# Patient Record
Sex: Female | Born: 1950 | Race: Black or African American | Hispanic: No | Marital: Married | Smoking: Never smoker
Health system: Southern US, Community
[De-identification: ages and names within clinical notes are randomized; demographics above are authoritative.]

## PROBLEM LIST (undated history)

## (undated) DIAGNOSIS — I10 Essential (primary) hypertension: Secondary | ICD-10-CM

---

## 2000-10-10 ENCOUNTER — Encounter: Payer: Self-pay | Admitting: *Deleted

## 2000-10-10 ENCOUNTER — Ambulatory Visit (HOSPITAL_COMMUNITY): Admission: RE | Admit: 2000-10-10 | Discharge: 2000-10-10 | Payer: Self-pay | Admitting: *Deleted

## 2000-11-04 ENCOUNTER — Encounter: Payer: Self-pay | Admitting: Cardiology

## 2000-11-04 ENCOUNTER — Ambulatory Visit (HOSPITAL_COMMUNITY): Admission: RE | Admit: 2000-11-04 | Discharge: 2000-11-04 | Payer: Self-pay | Admitting: Cardiology

## 2009-02-27 ENCOUNTER — Encounter: Admission: RE | Admit: 2009-02-27 | Discharge: 2009-02-27 | Payer: Self-pay | Admitting: Chiropractic Medicine

## 2009-05-02 ENCOUNTER — Encounter: Admission: RE | Admit: 2009-05-02 | Discharge: 2009-05-02 | Payer: Self-pay | Admitting: Obstetrics and Gynecology

## 2010-02-11 ENCOUNTER — Encounter: Payer: Self-pay | Admitting: Cardiology

## 2011-06-19 IMAGING — CR DG CERVICAL SPINE COMPLETE 4+V
5 series · 5 of 5 positions shown · non-contrast
Comparison: None.

CLINICAL DATA: Neck pain for 6 months, no trauma

CERVICAL SPINE - COMPLETE 4+ VIEW

[view not recorded (1 of 5)]
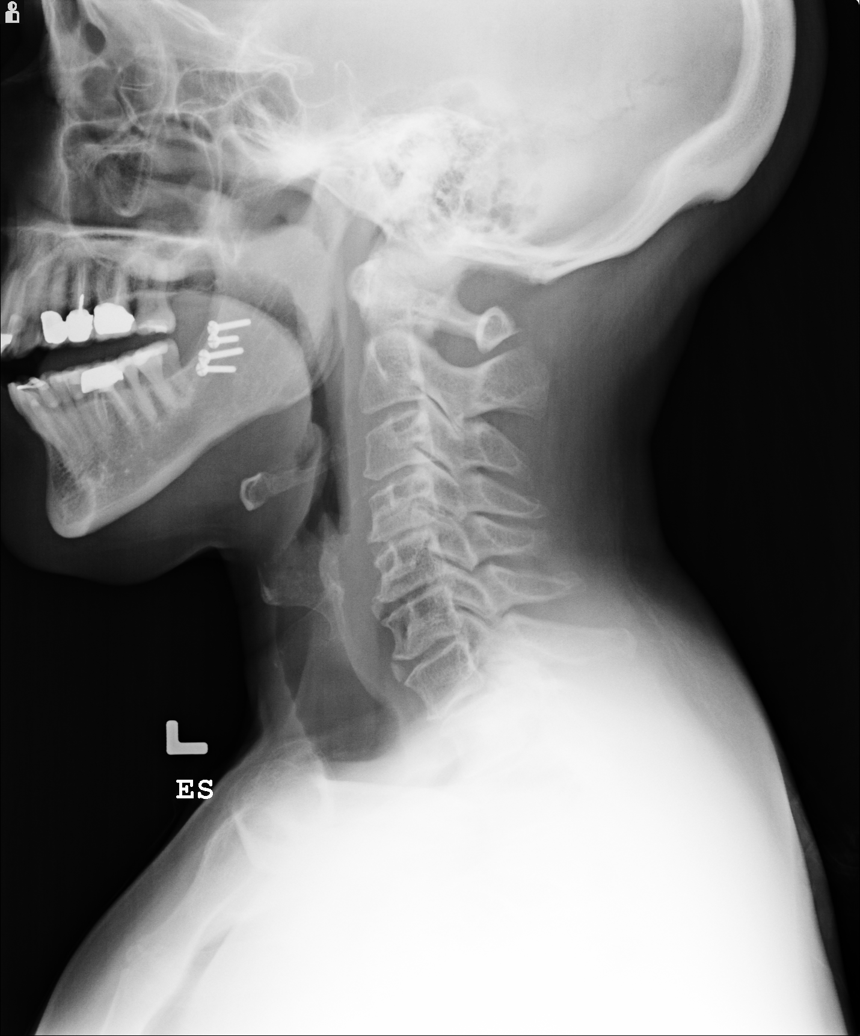

[view not recorded (2 of 5)]
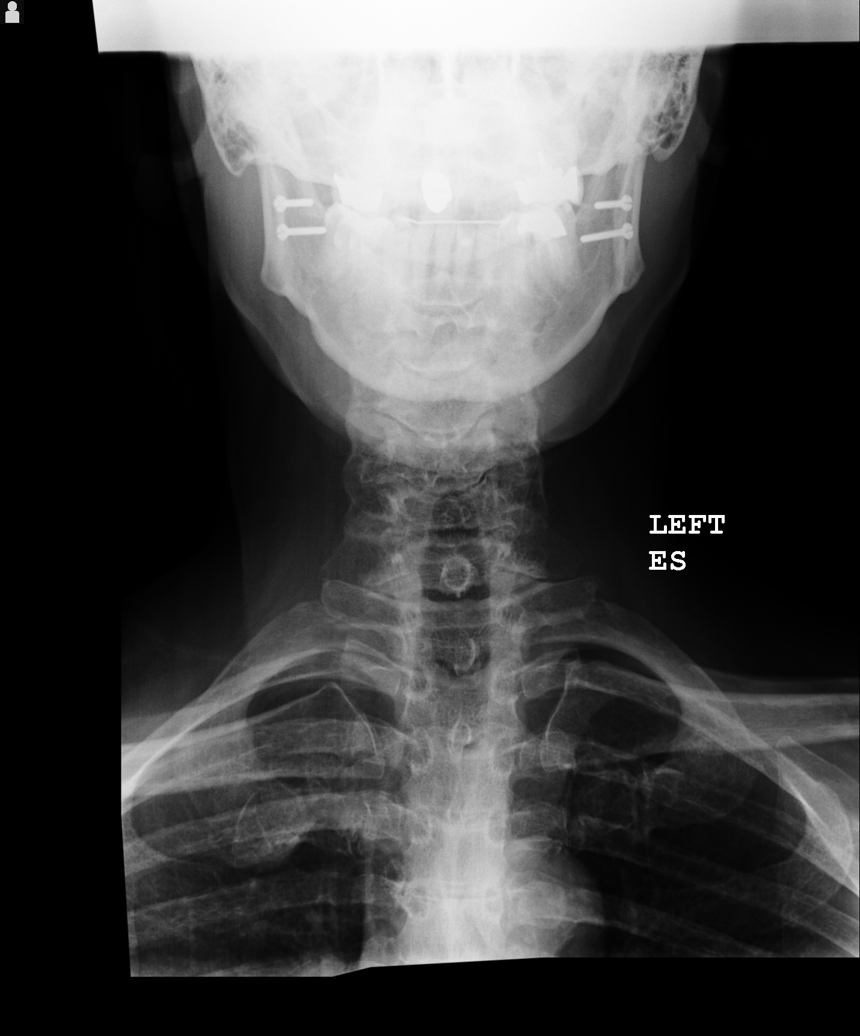

[view not recorded (3 of 5)]
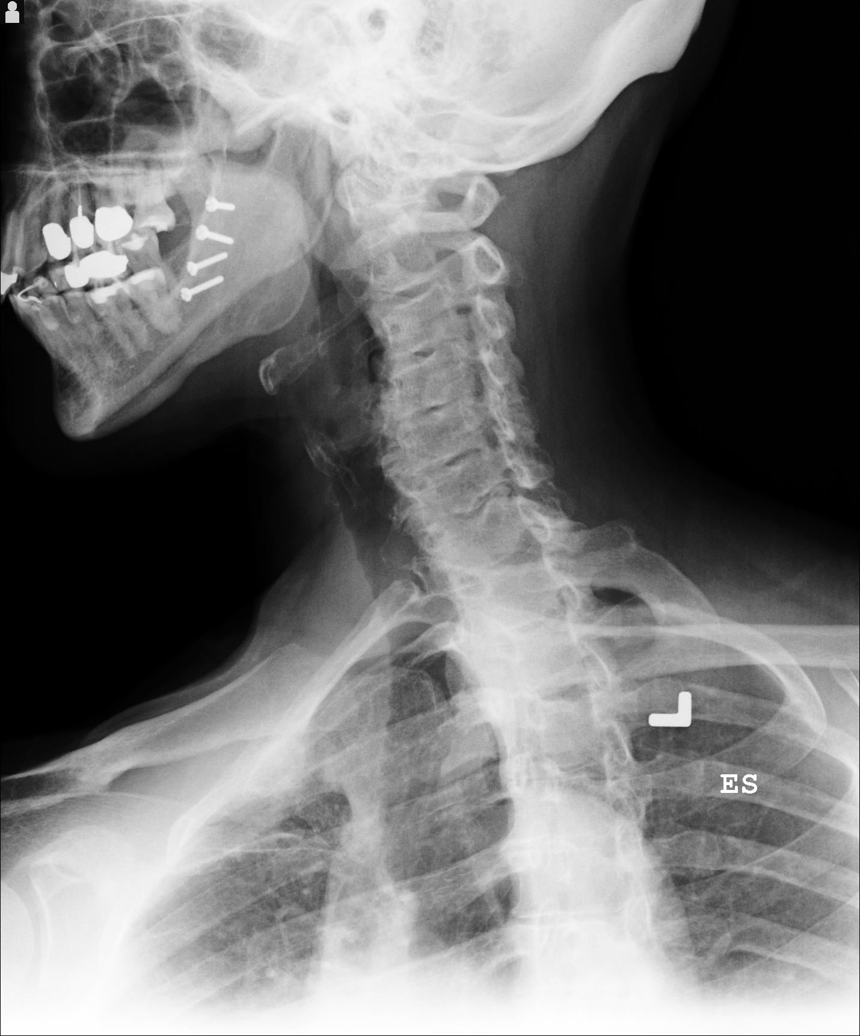

[view not recorded (4 of 5)]
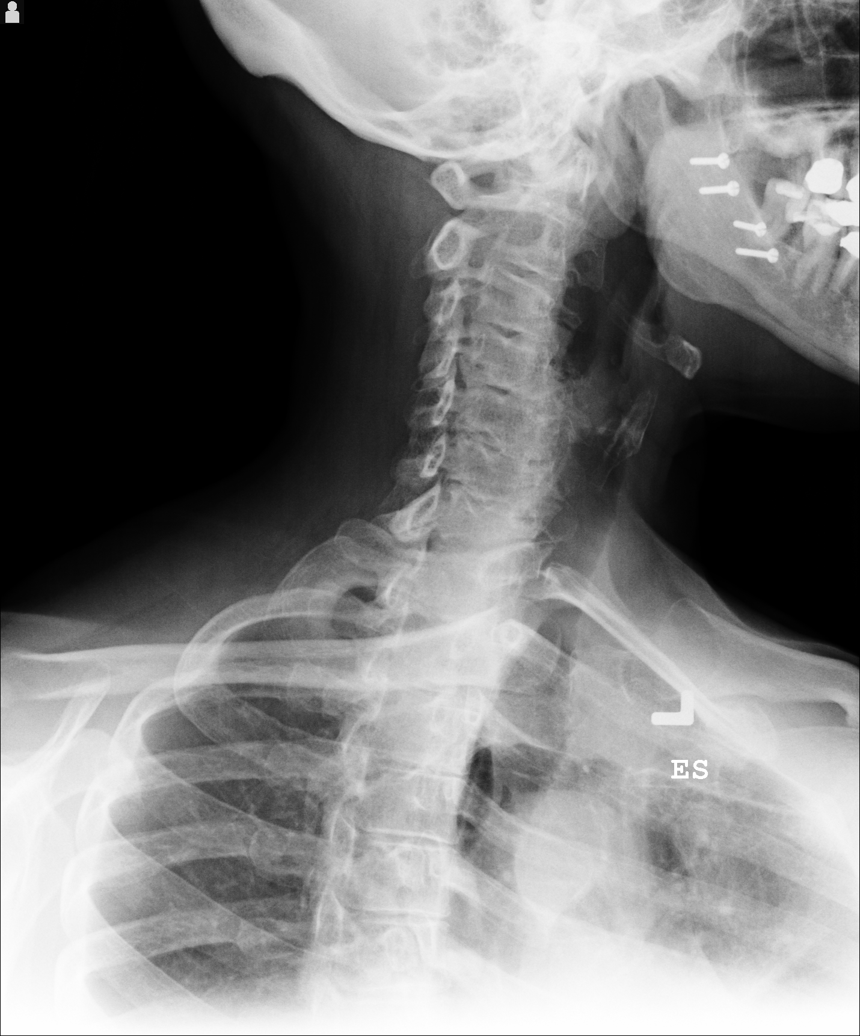

[view not recorded (5 of 5)]
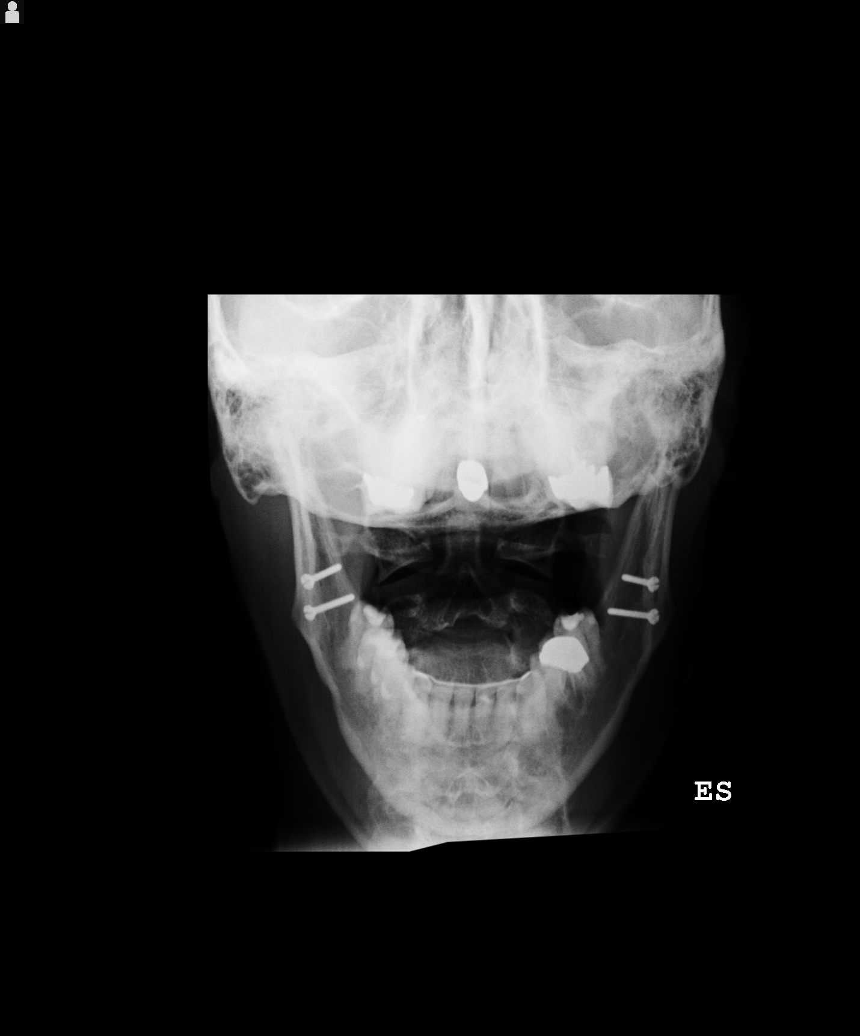

[5 of 5 positions shown; findings below may reference images not displayed]

FINDINGS: The cervical vertebrae are in normal alignment.  There is
degenerative disc disease particularly at C4-5, C5-6, and C6-7
levels where there is some loss of disc space and spurring.  No
prevertebral soft tissue swelling is seen.  On oblique views there
is some foraminal narrowing particularly at C4-5,  C5-6 and C6-7
levels.  The odontoid process is intact.
IMPRESSION: Degenerative disc disease particularly at C4-5, C5-6, and C6-7 with
some foraminal narrowing at these levels.  Normal alignment.

## 2016-09-11 ENCOUNTER — Ambulatory Visit: Payer: Self-pay | Admitting: Cardiovascular Disease

## 2018-12-22 ENCOUNTER — Other Ambulatory Visit: Payer: Self-pay | Admitting: Internal Medicine

## 2018-12-22 DIAGNOSIS — E2839 Other primary ovarian failure: Secondary | ICD-10-CM

## 2018-12-24 ENCOUNTER — Other Ambulatory Visit: Payer: Self-pay | Admitting: Internal Medicine

## 2018-12-24 DIAGNOSIS — Z1231 Encounter for screening mammogram for malignant neoplasm of breast: Secondary | ICD-10-CM

## 2019-03-22 ENCOUNTER — Ambulatory Visit: Payer: Self-pay

## 2019-03-22 ENCOUNTER — Other Ambulatory Visit: Payer: BC Managed Care – PPO

## 2019-05-13 ENCOUNTER — Ambulatory Visit: Payer: BC Managed Care – PPO | Attending: Internal Medicine

## 2019-05-13 ENCOUNTER — Ambulatory Visit: Payer: BC Managed Care – PPO

## 2019-05-13 DIAGNOSIS — Z23 Encounter for immunization: Secondary | ICD-10-CM

## 2019-05-13 NOTE — Progress Notes (Signed)
   Covid-19 Vaccination Clinic  Name:  Gina Dennis    MRN: [id number] DOB: February 14, 1950  05/13/2019  [name] was observed post Covid-19 immunization for 15 minutes without incident. She was provided with Vaccine Information Sheet and instruction to access the V-Safe system.   [name] was instructed to call 911 with any severe reactions post vaccine: Marland Kitchen Difficulty breathing  . Swelling of face and throat  . A fast heartbeat  . A bad rash all over body  . Dizziness and weakness   Immunizations Administered    Name Date Dose VIS Date Route   Pfizer COVID-19 Vaccine 05/13/2019 10:22 AM 0.3 mL 03/17/2018 Intramuscular   Manufacturer: [employer], [employer]   Lot: [id number]   NDC: [address]-1

## 2019-06-07 ENCOUNTER — Ambulatory Visit: Payer: BC Managed Care – PPO | Attending: Internal Medicine

## 2019-06-07 DIAGNOSIS — Z23 Encounter for immunization: Secondary | ICD-10-CM

## 2019-06-07 NOTE — Progress Notes (Signed)
   Covid-19 Vaccination Clinic  Name:  SHRUTHI NORTHRUP    MRN: [id number] DOB: 06-10-1950  06/07/2019  Ms. Krasinski was observed post Covid-19 immunization for 15 minutes without incident. She was provided with Vaccine Information Sheet and instruction to access the V-Safe system.   Ms. Weis was instructed to call 911 with any severe reactions post vaccine: Marland Kitchen Difficulty breathing  . Swelling of face and throat  . A fast heartbeat  . A bad rash all over body  . Dizziness and weakness   Immunizations Administered    Name Date Dose VIS Date Route   Pfizer COVID-19 Vaccine 06/07/2019 12:35 PM 0.3 mL 03/17/2018 Intramuscular   Manufacturer: [employer], [employer]   Lot: [id number]   NDC: [address]-8

## 2022-05-27 ENCOUNTER — Emergency Department (HOSPITAL_BASED_OUTPATIENT_CLINIC_OR_DEPARTMENT_OTHER)
Admission: EM | Admit: 2022-05-27 | Discharge: 2022-05-27 | Disposition: A | Discharge status: Home / Self Care | Payer: BC Managed Care – PPO | Attending: Emergency Medicine | Admitting: Emergency Medicine

## 2022-05-27 ENCOUNTER — Encounter (HOSPITAL_BASED_OUTPATIENT_CLINIC_OR_DEPARTMENT_OTHER): Payer: Self-pay | Admitting: Emergency Medicine

## 2022-05-27 ENCOUNTER — Other Ambulatory Visit: Payer: Self-pay

## 2022-05-27 DIAGNOSIS — I10 Essential (primary) hypertension: Secondary | ICD-10-CM | POA: Diagnosis present

## 2022-05-27 DIAGNOSIS — R001 Bradycardia, unspecified: Secondary | ICD-10-CM | POA: Diagnosis not present

## 2022-05-27 HISTORY — DX: Essential (primary) hypertension: I10

## 2022-05-27 LAB — CBC
HCT: 44 % (ref 36.0–46.0)
Hemoglobin: 15 g/dL (ref 12.0–15.0)
MCH: 30.3 pg (ref 26.0–34.0)
MCHC: 34.1 g/dL (ref 30.0–36.0)
MCV: 88.9 fL (ref 80.0–100.0)
Platelets: 220 10*3/uL (ref 150–400)
RBC: 4.95 MIL/uL (ref 3.87–5.11)
RDW: 12.4 % (ref 11.5–15.5)
WBC: 7.7 10*3/uL (ref 4.0–10.5)
nRBC: 0 % (ref 0.0–0.2)

## 2022-05-27 LAB — TROPONIN I (HIGH SENSITIVITY): Troponin I (High Sensitivity): 12 ng/L (ref <18)

## 2022-05-27 LAB — BASIC METABOLIC PANEL
Anion gap: 10 (ref 5–15)
BUN: 15 mg/dL (ref 8–23)
CO2: 28 mmol/L (ref 22–32)
Calcium: 10 mg/dL (ref 8.9–10.3)
Chloride: 100 mmol/L (ref 98–111)
Creatinine, Ser: 0.95 mg/dL (ref 0.44–1.00)
GFR, Estimated: >60 mL/min (ref >60)
Glucose, Bld: 86 mg/dL (ref 70–99)
Potassium: 3.7 mmol/L (ref 3.5–5.1)
Sodium: 138 mmol/L (ref 135–145)

## 2022-05-27 MED ORDER — ATROPINE SULFATE 1 MG/10ML IJ SOSY
PREFILLED_SYRINGE | INTRAMUSCULAR | Status: AC
  Filled 2022-05-27: qty 10

## 2022-05-27 MED ORDER — ATROPINE SULFATE 1 MG/10ML IJ SOSY: 0.5000 mg | PREFILLED_SYRINGE | Freq: Once | INTRAMUSCULAR | Status: DC

## 2022-05-27 MED ORDER — ATROPINE SULFATE 1 MG/ML IV SOLN
0.5000 mg | Freq: Once | INTRAVENOUS | Status: DC
  Filled 2022-05-27: qty 0.5

## 2022-05-27 MED ORDER — HYDRALAZINE HCL 20 MG/ML IJ SOLN
20.0000 mg | Freq: Once | INTRAMUSCULAR | Status: AC
  Administered 2022-05-27: 20 mg via INTRAVENOUS
  Filled 2022-05-27: qty 1

## 2022-05-27 NOTE — ED Triage Notes (Signed)
Pt arrives pov, steady gait, c/o concern for htn, referred by pcp. Pt endorses being asymptomatic, deneis HA or CP  Endorses non-compliance with htn medications. Reports diastolic 125 at pcp

## 2022-05-27 NOTE — ED Notes (Signed)
Unsuccessful IV attempt LAC, pt tolerated well 

## 2022-05-27 NOTE — ED Notes (Signed)
Pt. Now sitting up in bed, stating she feels "a little better." Fluid bolus complete. BP 178/84 and HR 64.

## 2022-05-27 NOTE — Discharge Instructions (Addendum)
Continue to take your blood pressure medication regularly.  You can take nebivolol at a higher dose however it can also cause decreasing heart rate and your heart rate is already on the lower end..  Follow-up with your primary doctor to discuss additional medications if your blood pressure remains elevated.

## 2022-05-27 NOTE — ED Provider Notes (Signed)
[place] EMERGENCY DEPARTMENT AT [facility] Provider Note   CSN: [id number] Arrival date & time: 05/27/22  1742     History  Chief Complaint  Patient presents with   Hypertension    Gina Dennis is a 72 y.o. female.   Hypertension   Patient has a history of hypertension.  She presents to the ED for evaluation of high blood pressure.  Patient states she has not been taking her blood pressure medication regularly.  Generally though it is well-controlled.  This weekend she was feeling fine but did check her blood pressure and her systolic was over 200 and her diastolic was over 100.  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  Patient denies any trouble with headache.  She is not having any chest pain.  She denies any numbness or weakness.  No shortness of breath.  She denies any new medications or supplements.    Home Medications Prior to Admission medications   Not on File      Allergies    Patient has no allergy information on record.    Review of Systems   Review of Systems  Physical Exam Updated Vital Signs BP (!) 178/84   Pulse 66   Temp (!) 97.5 F (36.4 C)   Resp 15   Ht 1.651 m (5\' 5" )   Wt 70.3 kg   SpO2 99%   BMI 25.79 kg/m  Physical Exam Vitals and nursing note reviewed.  Constitutional:      General: She is not in acute distress.    Appearance: She is well-developed.  HENT:     Head: Normocephalic and atraumatic.     Right Ear: External ear normal.     Left Ear: External ear normal.  Eyes:     General: No scleral icterus.       Right eye: No discharge.        Left eye: No discharge.     Conjunctiva/sclera: Conjunctivae normal.  Neck:     Trachea: No tracheal deviation.  Cardiovascular:     Rate and Rhythm: Normal rate and regular rhythm.  Pulmonary:     Effort: Pulmonary effort is normal. No respiratory distress.      Breath sounds: Normal breath sounds. No stridor. No wheezing or rales.  Abdominal:     General: Bowel sounds are normal. There is no distension.     Palpations: Abdomen is soft.     Tenderness: There is no abdominal tenderness. There is no guarding or rebound.  Musculoskeletal:        General: No tenderness or deformity.     Cervical back: Neck supple.  Skin:    General: Skin is warm and dry.     Findings: No rash.  Neurological:     General: No focal deficit present.     Mental Status: She is alert.     Cranial Nerves: No cranial nerve deficit, dysarthria or facial asymmetry.     Sensory: No sensory deficit.     Motor: No abnormal muscle tone or seizure activity.     Coordination: Coordination normal.  Psychiatric:        Mood and Affect: Mood normal.     ED Results / Procedures / Treatments   Labs (all labs ordered are listed, but only abnormal results are displayed) Labs Reviewed  CBC  BASIC METABOLIC PANEL  TROPONIN I (HIGH SENSITIVITY)    EKG EKG Interpretation  Date/Time:  Monday May 27 2022 20:04:45 EDT Ventricular Rate:  39 PR Interval:  172 QRS Duration: 96 QT Interval:  544 QTC Calculation: 439 R Axis:   30 Text Interpretation: Sinus bradycardia Anteroseptal infarct, old Nonspecific repol abnormality, lateral leads Since last tracing rate faster Confirmed by Linwood Dibbles [phone]) on 05/27/2022 8:09:35 PM  Radiology No results found.  Procedures Procedures    Medications Ordered in ED Medications  hydrALAZINE (APRESOLINE) injection 20 mg (20 mg Intravenous Given 05/27/22 1850)  hydrALAZINE (APRESOLINE) injection 20 mg (20 mg Intravenous Given 05/27/22 1953)    ED Course/ Medical Decision Making/ A&P Clinical Course as of 05/27/22 2127  Mon May 27, 2022  1941 CBC and metabolic performed normal.  First troponin normal [JK]  1942 BP is improving.  Will give an additional dose of medications [JK]  2014 Patient's blood pressure was still elevated I will order  additional dose of hydralazine but patient subsequently had a significant drop in blood pressure. [JK]  2015 Blood pressure down into the 120s.  Heart rate down into the 40s.  Patient was feeling lightheaded.  Suspect somewhat of a vasovagal component as the hydralazine should not directly cause bradycardia.  Blood pressure is slowly starting to improve.  Heart rate is improving.  Discussed giving atropine but family and patient are hesitant to do so understandably.  Will continue to monitor closely and allow the medication to wear off. [JK]  2034 Blood pressure improving.  Now 150 systolic, heart rate is 65 [JK]  2107 Nebivolol 10 [JK]    Clinical Course User Index [JK] Linwood Dibbles, MD                             Medical Decision Making Problems Addressed: Hypertension, unspecified type: acute illness or injury that poses a threat to life or bodily functions  Amount and/or Complexity of Data Reviewed Labs: ordered. Decision-making details documented in ED Course.  Risk Prescription drug management.   Patient presented to the ED for evaluation of hypertension.  Patient without any symptoms.  Blood test reassuring.  No signs of anemia acute kidney injury or signs of cardiac ischemia.  She was given a dose of hydralazine with improvement of her blood pressure.  It remained elevated and a second dose was ordered however before it was completely administered patient experienced low blood pressure and bradycardia.  We held any additional treatment and her blood pressure slowly returned back to her baseline.  Would not have her increase her nebivolol as she already has a low normal heart rate.  Patient states previously when she has elevated blood pressure like this her doctor was given her dose of clonidine but she does not take that regularly.  Will have her follow-up with her primary care doctor to discuss her blood pressure.  Right now we will make sure she is taking her blood pressure medication  regularly which she had not been doing recently and continue to monitor her blood pressure.        Final Clinical Impression(s) / ED Diagnoses Final diagnoses:  Hypertension, unspecified type    Rx / DC Orders ED Discharge Orders     None         Linwood Dibbles, MD 05/27/22 2127

## 2022-05-27 NOTE — ED Notes (Signed)
While giving second ordered dose of hyralazine, pt's HR dropped into the 30's and BP dropped precipitously from 182/61 to 102 sys. Pt. Became dizzy and head was lowered. Dr.Knapp was called to the room. NS fluid bolus begun. Dr.Knapp gave verbal order for 0.5mg  Atropine, but told me to hold it after I took it to room.Only 10mg  of hydralazine was administered.

## 2022-05-27 NOTE — ED Notes (Signed)
Pt ambulatory to restroom

## 2023-02-24 ENCOUNTER — Ambulatory Visit: Payer: BC Managed Care – PPO | Admitting: Family Medicine

## 2023-02-24 DIAGNOSIS — E663 Overweight: Secondary | ICD-10-CM | POA: Insufficient documentation

## 2023-02-24 DIAGNOSIS — E559 Vitamin D deficiency, unspecified: Secondary | ICD-10-CM | POA: Insufficient documentation

## 2023-02-24 DIAGNOSIS — I10 Essential (primary) hypertension: Secondary | ICD-10-CM | POA: Insufficient documentation

## 2023-02-25 ENCOUNTER — Ambulatory Visit (INDEPENDENT_AMBULATORY_CARE_PROVIDER_SITE_OTHER): Payer: Self-pay | Admitting: Family Medicine

## 2023-02-25 ENCOUNTER — Encounter: Payer: Self-pay | Admitting: Family Medicine

## 2023-02-25 VITALS — BP 200/108 | HR 60 | Temp 97.6°F | Resp 18 | Ht 64.5 in | Wt 166.4 lb

## 2023-02-25 DIAGNOSIS — R21 Rash and other nonspecific skin eruption: Secondary | ICD-10-CM | POA: Diagnosis not present

## 2023-02-25 DIAGNOSIS — I160 Hypertensive urgency: Secondary | ICD-10-CM | POA: Diagnosis not present

## 2023-02-25 DIAGNOSIS — Z7689 Persons encountering health services in other specified circumstances: Secondary | ICD-10-CM | POA: Diagnosis not present

## 2023-02-25 MED ORDER — CLONIDINE HCL 0.1 MG PO TABS
0.1000 mg | ORAL_TABLET | Freq: Once | ORAL | Status: AC
Start: 2023-02-25 — End: ?

## 2023-02-25 MED ORDER — TRIAMCINOLONE ACETONIDE 0.1 % EX CREA
1.0000 | TOPICAL_CREAM | Freq: Two times a day (BID) | CUTANEOUS | 1 refills | Status: AC
Start: 2023-02-25 — End: ?

## 2023-02-25 MED ORDER — AMLODIPINE BESYLATE 10 MG PO TABS
10.0000 mg | ORAL_TABLET | Freq: Every day | ORAL | 0 refills | Status: DC
Start: 2023-02-25 — End: 2023-03-10

## 2023-02-25 NOTE — Progress Notes (Signed)
 / New Patient Office Visit  Subjective    Patient ID: Gina Dennis, female    DOB: 29-Jan-1950  Age: 73 y.o. MRN: [id number]  CC:  Chief Complaint  Patient presents with   Establish Care    Patient is here establish care with PCP, Patient is here to discuss her BP issues, Patient also would like a referral  for dermatology     HPI Gina Dennis presents to establish care with this practice. She is new to me.   Elevated blood pressure: Medication compliance: Taking Bystolic 10 mg daily at different times. 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 Not consistently.  Denies chest pain, shortness of breath, lower extremity edema, vision changes, headaches.  Pertinent lab work: needs CMP today  Monitoring at home: 191/103 ,196/103, 194/101, 184/104, 187/115, 187/95 (reports some better blood pressures at times).  Tolerating medication well: no side effects reported.  Continue current medication regimen: Will start amlodipine   Follow-up: will send to cards.  Needs urgent referral to cardiology for evaluation and treatment plan.  Has tried Clonidine  in the past and will bring it down.  Clonidine  0.1 mg given in office today at 1025.  Has been to ED in the past for this and was not happy with them trying to bring her blood pressure down too fast.  Has had uncontrolled blood pressures for a while.  Works in [employer] with increased stress recently.  She will wait in clinic for blood pressure recheck.     Derm referral: rash/discoloration on left chest above clavicle, bilateral arms, and right lower leg. Using natural cream on skin. Itching. Present for one month.  Wonders if this is a parasite.    Outpatient Encounter Medications as of 02/25/2023  Medication Sig   amLODipine  (NORVASC ) 10 MG tablet Take 1 tablet (10 mg total) by mouth daily.   nebivolol (BYSTOLIC) 10 MG tablet Take 10 mg by mouth daily.   triamcinolone  cream (KENALOG )  0.1 % Apply 1 Application topically 2 (two) times daily.   Facility-Administered Encounter Medications as of 02/25/2023  Medication   cloNIDine  (CATAPRES ) tablet 0.1 mg    Past Medical History:  Diagnosis Date   Hypertension     History reviewed. No pertinent surgical history.  Family History  Problem Relation Age of Onset   Hypertension Mother    Hypertension Father     Social History   Socioeconomic History   Marital status: Married    Spouse name: Not on file   Number of children: Not on file   Years of education: Not on file   Highest education level: Not on file  Occupational History   Not on file  Tobacco Use   Smoking status: Never    Passive exposure: Never   Smokeless tobacco: Never  Vaping Use   Vaping status: Never Used  Substance and Sexual Activity   Alcohol use: Never   Drug use: Never   Sexual activity: Not Currently  Other Topics Concern   Not on file  Social History Narrative   Not on file   Social Drivers of Health   Financial Resource Strain: Not on file  Food Insecurity: Not on file  Transportation Needs: Not on file  Physical Activity: Not on file  Stress: Not on file (11/25/2022)  Social Connections: Not on file  Intimate Partner Violence: Low Risk  (05/05/2019)   Received from [facility]   Intimate Partner Violence  Insults You: Not on file    Threatens You: Not on file    Screams at You: Not on file    Physically Hurt: Not on file    Intimate Partner Violence Score: Not on file    Review of Systems  Eyes:  Negative for blurred vision and double vision.  Respiratory:  Negative for shortness of breath.   Cardiovascular:  Negative for chest pain and leg swelling.  Neurological:  Negative for dizziness and headaches.        Objective    BP (!) 200/108 (BP Location: Right Arm, Patient Position: Sitting, Cuff Size: Normal)   Pulse 60   Temp 97.6 F (36.4 C) (Oral)   Resp 18   Ht 5' 4.5 (1.638 m)   Wt  166 lb 6.4 oz (75.5 kg)   SpO2 100%   BMI 28.12 kg/m   Physical Exam Vitals and nursing note reviewed.  Constitutional:      General: She is not in acute distress.    Appearance: Normal appearance. She is normal weight.  Cardiovascular:     Rate and Rhythm: Regular rhythm.     Heart sounds: Normal heart sounds.  Pulmonary:     Effort: Pulmonary effort is normal.     Breath sounds: Normal breath sounds.  Skin:    General: Skin is warm and dry.  Neurological:     General: No focal deficit present.     Mental Status: She is alert. Mental status is at baseline.  Psychiatric:        Mood and Affect: Mood normal.        Behavior: Behavior normal.        Thought Content: Thought content normal.        Judgment: Judgment normal.        Assessment & Plan:   Problem List Items Addressed This Visit     Establishing care with new doctor, encounter for - Primary   Asymptomatic hypertensive urgency   Uncontrolled blood pressure in office and at home.  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  Denies chest pain, shortness of breath, vision changes, headaches or dizziness.  She has not been consistent with her Bystolic 10 mg daily as prescribed.  Reports to provider that clonidine  has been effective in the past to bring her blood pressure down.  Clonidine  0.1 mg given in clinic today.  She is resting in exam room for blood pressure recheck.  Urgent referral placed for asymptomatic hypertension urgency to cardiology for evaluation/treatment.  Blood pressure improved upon recheck after clonidine , however, not at goal.   She will wean herself off of the Bystolic, start amlodipine  10 mg daily today.  Recommend DASH diet and moderate walking, reports that she is a runner, instructed to avoid running until blood pressure is under control.  She will follow-up in 2 weeks with blood pressure log, sooner if needed.  Symptoms reviewed that would warrant seeking a higher level  of care such as chest pain, shortness of breath, headache.  She understands that she should report to the emergency room if these symptoms occur.  She is currently asymptomatic. CMP today to assess electrolytes and kidney function. Strongly encourage being consistent with medication and do not miss doses.       Relevant Medications   cloNIDine  (CATAPRES ) tablet 0.1 mg   amLODipine  (NORVASC ) 10 MG tablet   Other Relevant Orders   Comprehensive metabolic panel   Ambulatory referral to Cardiology  Rash and nonspecific skin eruption   Areas of skin discoloration on left chest above clavicle, bilateral arms and right lower leg.  She reports this is accompanied by itching.  Symptoms have been present for 1 month.  She is using a natural cream on the skin, unable to name this product.  She wonders if this is a parasite.  Will prescribe some triamcinolone  cream today, place an urgent referral for dermatology for further evaluation/treatment.      Relevant Medications   triamcinolone  cream (KENALOG ) 0.1 %   Other Relevant Orders   Ambulatory referral to Dermatology  Agrees with plan of care discussed.  Questions answered.   Return in about 2 weeks (around 03/11/2023) for HTN.   Darice JONELLE Brownie, FNP

## 2023-02-25 NOTE — Patient Instructions (Addendum)
 Weaning off of the beta blocker: Take every other day for one week, then every 2 days for one week, then every 3 days for one week, then every 4 days, then stop.  Start amlodipine  today. Monitor blood pressure daily. DASH diet  Moderate walking for now.   Healthy Heart:  Recommend heart healthy/Mediterranean diet, with whole grains, fruits, vegetable, fish, lean meats, nuts, and olive oil. Limit salt. Recommend moderate walking, 3-5 times/week for 30-50 minutes each session. Aim for at least 150 minutes.week. Goal should be pace of 3 miles/hours, or walking 1.5 miles in 30 minutes Recommend avoidance of tobacco products. Avoid excess alcohol.

## 2023-02-25 NOTE — Assessment & Plan Note (Signed)
 Areas of skin discoloration on left chest above clavicle, bilateral arms and right lower leg.  She reports this is accompanied by itching.  Symptoms have been present for 1 month.  She is using a natural cream on the skin, unable to name this product.  She wonders if this is a parasite.  Will prescribe some triamcinolone  cream today, place an urgent referral for dermatology for further evaluation/treatment.

## 2023-02-25 NOTE — Assessment & Plan Note (Addendum)
 Uncontrolled blood pressure in office and at home.  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  Denies chest pain, shortness of breath, vision changes, headaches or dizziness.  She has not been consistent with her Bystolic 10 mg daily as prescribed.  Reports to provider that clonidine  has been effective in the past to bring her blood pressure down.  Clonidine  0.1 mg given in clinic today.  She is resting in exam room for blood pressure recheck.  Urgent referral placed for asymptomatic hypertension urgency to cardiology for evaluation/treatment.  Blood pressure improved upon recheck after clonidine , however, not at goal.   She will wean herself off of the Bystolic, start amlodipine  10 mg daily today.  Recommend DASH diet and moderate walking, reports that she is a runner, instructed to avoid running until blood pressure is under control.  She will follow-up in 2 weeks with blood pressure log, sooner if needed.  Symptoms reviewed that would warrant seeking a higher level of care such as chest pain, shortness of breath, headache.  She understands that she should report to the emergency room if these symptoms occur.  She is currently asymptomatic. CMP today to assess electrolytes and kidney function. Strongly encourage being consistent with medication and do not miss doses.

## 2023-02-26 LAB — COMPREHENSIVE METABOLIC PANEL
ALT: 15 [IU]/L (ref 0–32)
AST: 24 [IU]/L (ref 0–40)
Albumin: 4.1 g/dL (ref 3.9–4.9)
Alkaline Phosphatase: 67 [IU]/L (ref 44–121)
BUN/Creatinine Ratio: 16 (ref 12–28)
BUN: 15 mg/dL (ref 8–27)
Bilirubin Total: 0.6 mg/dL (ref 0.0–1.2)
CO2: 24 mmol/L (ref 20–29)
Calcium: 9.4 mg/dL (ref 8.7–10.3)
Chloride: 100 mmol/L (ref 96–106)
Creatinine, Ser: 0.96 mg/dL (ref 0.57–1.00)
Globulin, Total: 2.7 g/dL (ref 1.5–4.5)
Glucose: 96 mg/dL (ref 70–99)
Potassium: 4 mmol/L (ref 3.5–5.2)
Sodium: 140 mmol/L (ref 134–144)
Total Protein: 6.8 g/dL (ref 6.0–8.5)
eGFR: 64 mL/min/{1.73_m2} (ref >59)

## 2023-03-10 ENCOUNTER — Ambulatory Visit (INDEPENDENT_AMBULATORY_CARE_PROVIDER_SITE_OTHER): Payer: No Typology Code available for payment source | Admitting: Family Medicine

## 2023-03-10 ENCOUNTER — Telehealth: Payer: Self-pay | Admitting: Family Medicine

## 2023-03-10 ENCOUNTER — Encounter: Payer: Self-pay | Admitting: Family Medicine

## 2023-03-10 VITALS — BP 188/75 | HR 71 | Temp 97.7°F | Ht 64.5 in | Wt 165.0 lb

## 2023-03-10 DIAGNOSIS — I10 Essential (primary) hypertension: Secondary | ICD-10-CM | POA: Diagnosis not present

## 2023-03-10 MED ORDER — AMLODIPINE-OLMESARTAN 10-20 MG PO TABS: 1.0000 | ORAL_TABLET | Freq: Every day | ORAL | 0 refills | Status: DC

## 2023-03-10 NOTE — Assessment & Plan Note (Addendum)
Taking amlodipine 10 mg daily as prescribed.  Denies chest pain, shortness of breath, lower extremity edema, vision changes, headaches.  Pertinent lab work: 2/4 CMP normal  Blood pressure is not at goal. Inquired about having 1 or 2 clonidine on hand in case of emergency. Discussed going on clonidine 0.1 mg twice daily, does not want to do this. Amlodipine 10 mg with olmesartan 20 mg daily prescribed.  Understands this is a combination drug, she will stop the amlodipine 10 mg as previously prescribed. Monitor blood pressure and keep blood pressure log. Recommend DASH diet, moderate exercise. Follow-up in 2 weeks with blood pressure log. Encouraged patient to sign up on my chart.

## 2023-03-10 NOTE — Telephone Encounter (Signed)
-----   Message from Novella Olive sent at 02/26/2023  7:06 AM EST ----- Please notify Gina Dennis that her CMP is normal.  Ask her how her blood pressure has been since starting the amlodipine. Thank you, Clydie Braun

## 2023-03-10 NOTE — Progress Notes (Signed)
Established Patient Office Visit  Subjective   Patient ID: Gina Dennis, female    DOB: 1950-11-23  Age: 73 y.o. MRN: [id number]  Chief Complaint  Patient presents with   Hypertension    Home BP's log patient brought in home BP log  Requesting rx for one or two clonidine to have on hand just for emergency     HPI  Hypertension Medication compliance: Taking amlodipine 10 mg daily as prescribed.  Denies chest pain, shortness of breath, lower extremity edema, vision changes, headaches.  Pertinent lab work: 2/4 CMP normal  Monitoring at home: 115-187/85-95 Tolerating medication well: no side effects reported.  Continue current medication regimen: add olmesartan 20 mg  Follow-up: 2 weeks    Review of Systems  Eyes:  Negative for blurred vision and double vision.  Respiratory:  Negative for shortness of breath.   Cardiovascular:  Negative for chest pain and leg swelling.  Neurological:  Negative for headaches.      Objective:     BP (!) 188/75   Pulse 71   Temp 97.7 F (36.5 C)   Ht 5' 4.5" (1.638 m)   Wt 165 lb (74.8 kg)   SpO2 98%   BMI 27.88 kg/m  BP Readings from Last 3 Encounters:  03/10/23 (!) 188/75  02/25/23 (!) 200/108  05/27/22 (!) 190/81      Physical Exam Vitals and nursing note reviewed.  Constitutional:      General: She is not in acute distress.    Appearance: Normal appearance.  Cardiovascular:     Rate and Rhythm: Regular rhythm.     Heart sounds: Normal heart sounds.  Pulmonary:     Effort: Pulmonary effort is normal.     Breath sounds: Normal breath sounds.  Skin:    General: Skin is warm and dry.  Neurological:     General: No focal deficit present.     Mental Status: She is alert. Mental status is at baseline.  Psychiatric:        Mood and Affect: Mood normal.        Behavior: Behavior normal.        Thought Content: Thought content normal.        Judgment: Judgment normal.     No results found for any visits on  03/10/23.  Last metabolic panel Lab Results  Component Value Date   GLUCOSE 96 02/25/2023   NA 140 02/25/2023   K 4.0 02/25/2023   CL 100 02/25/2023   CO2 24 02/25/2023   BUN 15 02/25/2023   CREATININE 0.96 02/25/2023   EGFR 64 02/25/2023   CALCIUM 9.4 02/25/2023   PROT 6.8 02/25/2023   ALBUMIN 4.1 02/25/2023   LABGLOB 2.7 02/25/2023   BILITOT 0.6 02/25/2023   ALKPHOS 67 02/25/2023   AST 24 02/25/2023   ALT 15 02/25/2023   ANIONGAP 10 05/27/2022      The ASCVD Risk score (Arnett DK, et al., 2019) failed to calculate for the following reasons:   Cannot find a previous HDL lab   Cannot find a previous total cholesterol lab    Assessment & Plan:   Problem List Items Addressed This Visit     Elevated blood pressure reading in office with diagnosis of hypertension - Primary    Taking amlodipine 10 mg daily as prescribed.  Denies chest pain, shortness of breath, lower extremity edema, vision changes, headaches.  Pertinent lab work: 2/4 CMP normal  Blood pressure is not at goal. Inquired about  having 1 or 2 clonidine on hand in case of emergency. Discussed going on clonidine 0.1 mg twice daily, does not want to do this. Amlodipine 10 mg with olmesartan 20 mg daily prescribed.  Understands this is a combination drug, she will stop the amlodipine 10 mg as previously prescribed. Monitor blood pressure and keep blood pressure log. Recommend DASH diet, moderate exercise. Follow-up in 2 weeks with blood pressure log. Encouraged patient to sign up on my chart.        Relevant Medications   amlodipine-olmesartan (AZOR) 10-20 MG tablet  Agrees with plan of care discussed.  Questions answered.  Face to face time: 29 minutes discussing treatment options.  Return in about 2 weeks (around 03/24/2023) for HTN.    Novella Olive, FNP

## 2023-03-10 NOTE — Telephone Encounter (Signed)
Results to be reviewed with patient at upcoming visit.

## 2023-03-20 ENCOUNTER — Encounter (HOSPITAL_BASED_OUTPATIENT_CLINIC_OR_DEPARTMENT_OTHER): Payer: Self-pay

## 2023-03-24 ENCOUNTER — Encounter: Payer: Self-pay | Admitting: Family Medicine

## 2023-03-24 ENCOUNTER — Ambulatory Visit (INDEPENDENT_AMBULATORY_CARE_PROVIDER_SITE_OTHER): Payer: No Typology Code available for payment source | Admitting: Family Medicine

## 2023-03-24 VITALS — BP 146/84 | HR 74 | Ht 64.0 in | Wt 163.2 lb

## 2023-03-24 DIAGNOSIS — I10 Essential (primary) hypertension: Secondary | ICD-10-CM | POA: Diagnosis not present

## 2023-03-24 MED ORDER — AMLODIPINE BESYLATE 10 MG PO TABS
10.0000 mg | ORAL_TABLET | Freq: Every day | ORAL | 0 refills | Status: DC
Start: 2023-03-24 — End: 2023-04-29

## 2023-03-24 MED ORDER — HYDROCHLOROTHIAZIDE 25 MG PO TABS
25.0000 mg | ORAL_TABLET | Freq: Every day | ORAL | 0 refills | Status: DC
Start: 2023-03-24 — End: 2023-05-05

## 2023-03-24 NOTE — Progress Notes (Signed)
 Established Patient Office Visit  Subjective   Patient ID: Gina Dennis, female    DOB: 06-06-1950  Age: 73 y.o. MRN: [id number]  Chief Complaint  Patient presents with   Hypertension    BP follow up =kph    HPI  Hypertension Medication compliance: Taking amlodipine-olmesartan 10-20 mg (half dose every other day).  Denies chest pain, shortness of breath,vision changes, headaches. Endorsing lower extremity swelling after starting the amlodipine-olmesartan.  Pertinent lab work: 2/4:  Monitoring at home: 136-168/70-99  Tolerating medication well: ankle swelling Continue current medication regimen: will stop olmesartan and add hydrochlorothiazide  Follow-up: 3 weeks.    Review of Systems  Eyes:  Negative for blurred vision and double vision.  Respiratory:  Negative for shortness of breath.   Cardiovascular:  Positive for leg swelling. Negative for chest pain.  Neurological:  Negative for dizziness and headaches.      Objective:     BP (!) 146/84 (BP Location: Right Arm, Patient Position: Sitting, Cuff Size: Normal)   Pulse 74   Ht 5\' 4"  (1.626 m)   Wt 163 lb 3 oz (74 kg)   SpO2 98%   BMI 28.01 kg/m  BP Readings from Last 3 Encounters:  03/24/23 (!) 146/84  03/10/23 (!) 188/75  02/25/23 (!) 200/108      Physical Exam Vitals and nursing note reviewed.  Constitutional:      General: She is not in acute distress.    Appearance: Normal appearance.  Cardiovascular:     Rate and Rhythm: Normal rate and regular rhythm.     Heart sounds: Normal heart sounds.  Pulmonary:     Effort: Pulmonary effort is normal.     Breath sounds: Normal breath sounds.  Skin:    General: Skin is warm and dry.  Neurological:     General: No focal deficit present.     Mental Status: She is alert. Mental status is at baseline.  Psychiatric:        Mood and Affect: Mood normal.        Behavior: Behavior normal.        Thought Content: Thought content normal.        Judgment:  Judgment normal.     No results found for any visits on 03/24/23.    The ASCVD Risk score (Arnett DK, et al., 2019) failed to calculate for the following reasons:   Cannot find a previous HDL lab   Cannot find a previous total cholesterol lab    Assessment & Plan:   Problem List Items Addressed This Visit     Elevated blood pressure reading in office with diagnosis of hypertension - Primary   Taking amlodipine-olmesartan 10-20 mg (half dose every other day) due to swelling in bilateral lower extremities. Reports swelling started after first dose.  Did not have swelling with just amlodipine 10 mg alone. Denies chest pain, shortness of breath,vision changes, headaches.  Will continue amlodipine 10 mg and add hydrochlorothiazide 25 mg with continued blood pressure monitoring. She will return in 3 weeks with blood pressure log. Home pressures are improved, however, not at goal. Improved upon manual recheck in office today.  Recheck BMP at that visit.        Relevant Medications   amLODipine (NORVASC) 10 MG tablet   hydrochlorothiazide (HYDRODIURIL) 25 MG tablet  Agrees with plan of care discussed.  Questions answered.   Return in about 3 weeks (around 04/14/2023) for HTN.    Novella Olive, FNP

## 2023-03-24 NOTE — Assessment & Plan Note (Signed)
 Taking amlodipine-olmesartan 10-20 mg (half dose every other day) due to swelling in bilateral lower extremities. Reports swelling started after first dose.  Did not have swelling with just amlodipine 10 mg alone. Denies chest pain, shortness of breath,vision changes, headaches.  Will continue amlodipine 10 mg and add hydrochlorothiazide 25 mg with continued blood pressure monitoring. She will return in 3 weeks with blood pressure log. Home pressures are improved, however, not at goal. Improved upon manual recheck in office today.  Recheck BMP at that visit.

## 2023-04-14 ENCOUNTER — Ambulatory Visit (INDEPENDENT_AMBULATORY_CARE_PROVIDER_SITE_OTHER): Admitting: Family Medicine

## 2023-04-14 ENCOUNTER — Encounter: Payer: Self-pay | Admitting: Family Medicine

## 2023-04-14 VITALS — BP 170/94 | HR 64 | Temp 97.4°F | Resp 18 | Ht 64.0 in | Wt 165.8 lb

## 2023-04-14 DIAGNOSIS — I10 Essential (primary) hypertension: Secondary | ICD-10-CM | POA: Diagnosis not present

## 2023-04-14 NOTE — Assessment & Plan Note (Addendum)
 Taking amlodipine 10 mg and hydrochlorothiazide 25 mg daily on most days. Reports not taking either medication when ankles are swollen.  Denies chest pain, shortness of breath, vision changes, headaches. Endorses bilateral ankle swelling at times.  Has missed some days of medication due to ankle swelling. Blood pressure log with some well controlled readings. Plan: Do not skip any doses/days of taking medication. Understands that changing regimen would not be prudent with missing days of treatment.  Keep log. Continue with healthy eating and exercise. Return in 2 weeks with BP log. If continues to be above goal, will adjust medications. Will check BMP once taking medication every day as prescribed.

## 2023-04-14 NOTE — Progress Notes (Signed)
 Established Patient Office Visit  Subjective   Patient ID: Gina Dennis, female    DOB: Jan 09, 1951  Age: 73 y.o. MRN: [id number]  Chief Complaint  Patient presents with   Follow-up    Patient is here for a 3 week follow up for BP      HPI   Hypertension Medication compliance: Taking amlodipine 10 mg and hydrochlorothiazide 25 mg daily on most days. Reports not taking either medication when ankles are swollen.  Denies chest pain, shortness of breath, vision changes, headaches. Endorses bilateral ankle  swelling at times.  Pertinent lab work: 02/25/23: CMP K+ 4.0, GFR 64 Monitoring at home: 120s-140s/70s-80s.  Tolerating medication well: some lower extremity swelling, no swelling today.  Continue current medication regimen: no changes today, will take daily with monitoring.  Follow-up: 2 weeks with blood pressure log  Review of Systems  Respiratory:  Negative for shortness of breath.   Cardiovascular:  Positive for leg swelling. Negative for chest pain.  Neurological:  Negative for headaches.      Objective:     BP (!) 170/94 (BP Location: Right Arm, Patient Position: Sitting, Cuff Size: Normal)   Pulse 64   Temp (!) 97.4 F (36.3 C) (Oral)   Resp 18   Ht 5\' 4"  (1.626 m)   Wt 165 lb 12.8 oz (75.2 kg)   SpO2 97%   BMI 28.46 kg/m  BP Readings from Last 3 Encounters:  04/14/23 (!) 170/94  03/24/23 (!) 146/84  03/10/23 (!) 188/75      Physical Exam Vitals and nursing note reviewed.  Constitutional:      Appearance: Normal appearance.  Cardiovascular:     Rate and Rhythm: Normal rate and regular rhythm.     Heart sounds: Normal heart sounds.  Pulmonary:     Effort: Pulmonary effort is normal.     Breath sounds: Normal breath sounds.  Musculoskeletal:     Right lower leg: No edema.     Left lower leg: No edema.  Skin:    General: Skin is warm and dry.  Neurological:     General: No focal deficit present.     Mental Status: She is alert. Mental status  is at baseline.  Psychiatric:        Mood and Affect: Mood normal.        Behavior: Behavior normal.        Thought Content: Thought content normal.        Judgment: Judgment normal.     No results found for any visits on 04/14/23.    The ASCVD Risk score (Arnett DK, et al., 2019) failed to calculate for the following reasons:   Cannot find a previous HDL lab   Cannot find a previous total cholesterol lab    Assessment & Plan:   Problem List Items Addressed This Visit     Elevated blood pressure reading in office with diagnosis of hypertension - Primary    Taking amlodipine 10 mg and hydrochlorothiazide 25 mg daily on most days. Reports not taking either medication when ankles are swollen.  Denies chest pain, shortness of breath, vision changes, headaches. Endorses bilateral ankle swelling at times.  Has missed some days of medication due to ankle swelling. Blood pressure log with some well controlled readings. Plan: Do not skip any doses/days of taking medication. Understands that changing regimen would not be prudent with missing days of treatment.  Keep log. Continue with healthy eating and exercise. Return in 2 weeks with  BP log. If continues to be above goal, will adjust medications. Will check BMP once taking medication every day as prescribed.       Agrees with plan of care discussed.  Questions answered.  Total face to face time discussing plan: 28 minutes   Return in about 2 weeks (around 04/28/2023) for HTN.    Novella Olive, FNP

## 2023-04-15 ENCOUNTER — Other Ambulatory Visit: Payer: Self-pay | Admitting: Family Medicine

## 2023-04-15 DIAGNOSIS — I10 Essential (primary) hypertension: Secondary | ICD-10-CM

## 2023-04-28 ENCOUNTER — Ambulatory Visit: Admitting: Family Medicine

## 2023-04-29 ENCOUNTER — Other Ambulatory Visit: Payer: Self-pay | Admitting: Family Medicine

## 2023-04-29 DIAGNOSIS — I10 Essential (primary) hypertension: Secondary | ICD-10-CM

## 2023-05-04 ENCOUNTER — Other Ambulatory Visit: Payer: Self-pay | Admitting: Family Medicine

## 2023-05-04 DIAGNOSIS — I10 Essential (primary) hypertension: Secondary | ICD-10-CM

## 2023-05-20 ENCOUNTER — Encounter: Payer: Self-pay | Admitting: Family Medicine

## 2023-05-20 ENCOUNTER — Ambulatory Visit (INDEPENDENT_AMBULATORY_CARE_PROVIDER_SITE_OTHER): Admitting: Family Medicine

## 2023-05-20 VITALS — BP 138/80 | HR 60 | Temp 98.9°F | Resp 18 | Ht 64.0 in | Wt 164.6 lb

## 2023-05-20 DIAGNOSIS — L280 Lichen simplex chronicus: Secondary | ICD-10-CM | POA: Diagnosis not present

## 2023-05-20 DIAGNOSIS — L2089 Other atopic dermatitis: Secondary | ICD-10-CM | POA: Diagnosis not present

## 2023-05-20 DIAGNOSIS — I10 Essential (primary) hypertension: Secondary | ICD-10-CM | POA: Diagnosis not present

## 2023-05-20 MED ORDER — HYDROCHLOROTHIAZIDE 25 MG PO TABS
25.0000 mg | ORAL_TABLET | Freq: Every day | ORAL | 0 refills | Status: DC
Start: 2023-05-20 — End: 2023-08-20

## 2023-05-20 MED ORDER — AMLODIPINE BESYLATE 10 MG PO TABS
10.0000 mg | ORAL_TABLET | Freq: Every day | ORAL | 0 refills | Status: DC
Start: 2023-05-20 — End: 2023-08-19

## 2023-05-20 NOTE — Progress Notes (Signed)
 Established Patient Office Visit  Subjective   Patient ID: Gina Dennis, female    DOB: 1950/06/12  Age: 73 y.o. MRN: [id number]  Chief Complaint  Patient presents with   Medical Management of Chronic Issues    2wk HTN    HPI  Hypertension Medication compliance: Taking amlodipine  10 mg and hydrochlorothiazide  25 mg daily as prescribed.  Denies chest pain, shortness of breath,  vision changes, headaches. Endorsing ankle swelling.  Pertinent lab work: will get BMP today  Monitoring at home: 120-140/70-85 Tolerating medication well: having lower extremity edema Continue current medication regimen: no changes  Follow-up: 3 months    Review of Systems  Eyes:  Negative for blurred vision and double vision.  Respiratory:  Negative for shortness of breath.   Cardiovascular:  Positive for leg swelling. Negative for chest pain.  Neurological:  Negative for headaches.      Objective:     BP 138/80 (BP Location: Right Arm, Patient Position: Sitting, Cuff Size: Large)   Pulse 60   Temp 98.9 F (37.2 C) (Oral)   Resp 18   Ht 5\' 4"  (1.626 m)   Wt 164 lb 9 oz (74.6 kg)   SpO2 97%   BMI 28.25 kg/m  BP Readings from Last 3 Encounters:  05/20/23 138/80  04/14/23 (!) 170/94  03/24/23 (!) 146/84      Physical Exam Vitals and nursing note reviewed.  Constitutional:      General: She is not in acute distress.    Appearance: Normal appearance.  Cardiovascular:     Rate and Rhythm: Regular rhythm.     Heart sounds: Normal heart sounds.  Pulmonary:     Effort: Pulmonary effort is normal.     Breath sounds: Normal breath sounds.  Skin:    General: Skin is warm and dry.  Neurological:     General: No focal deficit present.     Mental Status: She is alert. Mental status is at baseline.  Psychiatric:        Mood and Affect: Mood normal.        Behavior: Behavior normal.        Thought Content: Thought content normal.        Judgment: Judgment normal.     No  results found for any visits on 05/20/23.  Last metabolic panel Lab Results  Component Value Date   GLUCOSE 96 02/25/2023   NA 140 02/25/2023   K 4.0 02/25/2023   CL 100 02/25/2023   CO2 24 02/25/2023   BUN 15 02/25/2023   CREATININE 0.96 02/25/2023   EGFR 64 02/25/2023   CALCIUM 9.4 02/25/2023   PROT 6.8 02/25/2023   ALBUMIN 4.1 02/25/2023   LABGLOB 2.7 02/25/2023   BILITOT 0.6 02/25/2023   ALKPHOS 67 02/25/2023   AST 24 02/25/2023   ALT 15 02/25/2023   ANIONGAP 10 05/27/2022      The ASCVD Risk score (Arnett DK, et al., 2019) failed to calculate for the following reasons:   Cannot find a previous HDL lab   Cannot find a previous total cholesterol lab    Assessment & Plan:   Problem List Items Addressed This Visit     Essential hypertension - Primary   Taking amlodipine  10 mg and hydrochlorothiazide  25 mg daily as prescribed.  Denies chest pain, shortness of breath,  vision changes, headaches. Endorsing ankle swelling.  Blood pressure well controlled at home and in the office. Recommend wearing compression socks and elevating legs as much  as possible. Refill sent. Follow-up in 3 months, sooner if anything changes. BMP today to assess potassium level.       Relevant Medications   amLODipine  (NORVASC ) 10 MG tablet   hydrochlorothiazide  (HYDRODIURIL ) 25 MG tablet   Other Relevant Orders   Basic metabolic panel with GFR  Agrees with plan of care discussed.  Questions answered.   Return in about 3 months (around 08/19/2023) for HTN.    Mickiel Albany, FNP

## 2023-05-20 NOTE — Assessment & Plan Note (Signed)
 Taking amlodipine  10 mg and hydrochlorothiazide  25 mg daily as prescribed.  Denies chest pain, shortness of breath,  vision changes, headaches. Endorsing ankle swelling.  Blood pressure well controlled at home and in the office. Recommend wearing compression socks and elevating legs as much as possible. Refill sent. Follow-up in 3 months, sooner if anything changes. BMP today to assess potassium level.

## 2023-05-21 ENCOUNTER — Encounter: Payer: Self-pay | Admitting: Family Medicine

## 2023-05-21 LAB — BASIC METABOLIC PANEL WITH GFR
BUN/Creatinine Ratio: 21 (ref 12–28)
BUN: 20 mg/dL (ref 8–27)
CO2: 28 mmol/L (ref 20–29)
Calcium: 9.7 mg/dL (ref 8.7–10.3)
Chloride: 100 mmol/L (ref 96–106)
Creatinine, Ser: 0.95 mg/dL (ref 0.57–1.00)
Glucose: 80 mg/dL (ref 70–99)
Potassium: 4 mmol/L (ref 3.5–5.2)
Sodium: 142 mmol/L (ref 134–144)
eGFR: 64 mL/min/{1.73_m2} (ref >59)

## 2023-06-09 DIAGNOSIS — E663 Overweight: Secondary | ICD-10-CM | POA: Diagnosis not present

## 2023-06-09 DIAGNOSIS — E785 Hyperlipidemia, unspecified: Secondary | ICD-10-CM | POA: Diagnosis not present

## 2023-06-09 DIAGNOSIS — I10 Essential (primary) hypertension: Secondary | ICD-10-CM | POA: Diagnosis not present

## 2023-06-09 DIAGNOSIS — Z6827 Body mass index (BMI) 27.0-27.9, adult: Secondary | ICD-10-CM | POA: Diagnosis not present

## 2023-06-09 DIAGNOSIS — Z008 Encounter for other general examination: Secondary | ICD-10-CM | POA: Diagnosis not present

## 2023-07-07 NOTE — Progress Notes (Signed)
   07/07/2023  Patient ID: Gina Dennis, female   DOB: 1950/06/24, 73 y.o.   MRN: [id number]  This patient is appearing on a report for being at risk of failing the adherence measure for hypertension (ACEi/ARB) medications this calendar year.   Medication: amlodipine /olmesartan  10/20 mg Last fill date: 2/17 for 30 day supply.  Medication was changed to just amlodipine  )due to swelling that patient did not experience when previously on amlodipine ); and this was filled 3/3 for 30 days, 4/8 for 30 days, and 5/11 for 90 days.  Insurance report was not up to date. No action needed at this time.   Linn Rich, PharmD, DPLA

## 2023-07-15 DIAGNOSIS — L280 Lichen simplex chronicus: Secondary | ICD-10-CM | POA: Diagnosis not present

## 2023-07-15 DIAGNOSIS — L538 Other specified erythematous conditions: Secondary | ICD-10-CM | POA: Diagnosis not present

## 2023-07-15 DIAGNOSIS — L2989 Other pruritus: Secondary | ICD-10-CM | POA: Diagnosis not present

## 2023-07-15 DIAGNOSIS — L2089 Other atopic dermatitis: Secondary | ICD-10-CM | POA: Diagnosis not present

## 2023-08-19 ENCOUNTER — Encounter: Payer: Self-pay | Admitting: Family Medicine

## 2023-08-19 ENCOUNTER — Other Ambulatory Visit: Payer: Self-pay | Admitting: Family Medicine

## 2023-08-19 ENCOUNTER — Ambulatory Visit (INDEPENDENT_AMBULATORY_CARE_PROVIDER_SITE_OTHER): Admitting: Family Medicine

## 2023-08-19 VITALS — BP 156/94 | HR 69 | Temp 98.6°F | Ht 65.0 in | Wt 164.0 lb

## 2023-08-19 DIAGNOSIS — I10 Essential (primary) hypertension: Secondary | ICD-10-CM

## 2023-08-19 MED ORDER — AMLODIPINE BESYLATE 5 MG PO TABS
5.0000 mg | ORAL_TABLET | Freq: Every day | ORAL | 0 refills | Status: DC
Start: 2023-08-19 — End: 2023-11-14

## 2023-08-19 NOTE — Assessment & Plan Note (Addendum)
 Taking amlodipine  5 mg and hydrochlorothiazide  25 mg daily. Prescribed 10 mg  Denies chest pain, shortness of breath, lower extremity edema, vision changes, headaches.  Uncontrolled upon check in office. Home readings are well controlled.

## 2023-08-19 NOTE — Progress Notes (Signed)
   Established Patient Office Visit  Subjective   Patient ID: Gina Dennis, female    DOB: 02-05-1950  Age: 73 y.o. MRN: [id number]  Chief Complaint  Patient presents with   Hypertension    HPI  Hypertension Medication compliance: Taking amlodipine  5 mg and hydrochlorothiazide  25 mg daily Denies chest pain, shortness of breath, lower extremity edema, vision changes, headaches.  Pertinent lab work: BMP  Monitoring at home: 120-130s/7-80s consistently  Tolerating medication well: reports ankle swelling and started taking 5 mg instead of 10 mg amlodipine  Continue current medication regimen: no change  Follow-up: 3 months    Review of Systems  Eyes:  Negative for blurred vision and double vision.  Respiratory:  Negative for shortness of breath.   Cardiovascular:  Negative for chest pain.  Neurological:  Negative for headaches.      Objective:     BP (!) 156/94 (BP Location: Right Arm, Patient Position: Sitting, Cuff Size: Normal)   Pulse 69   Temp 98.6 F (37 C) (Oral)   Ht 5' 5 (1.651 m)   Wt 164 lb (74.4 kg)   SpO2 99%   BMI 27.29 kg/m  BP Readings from Last 3 Encounters:  08/19/23 (!) 156/94  05/20/23 138/80  04/14/23 (!) 170/94      Physical Exam Vitals and nursing note reviewed.  Constitutional:      Appearance: Normal appearance.  Cardiovascular:     Rate and Rhythm: Normal rate and regular rhythm.     Heart sounds: Normal heart sounds.  Pulmonary:     Effort: Pulmonary effort is normal.     Breath sounds: Normal breath sounds.  Skin:    General: Skin is warm and dry.  Neurological:     General: No focal deficit present.     Mental Status: She is alert. Mental status is at baseline.  Psychiatric:        Mood and Affect: Mood normal.        Behavior: Behavior normal.        Thought Content: Thought content normal.        Judgment: Judgment normal.      No results found for any visits on 08/19/23.    The ASCVD Risk score (Arnett DK,  et al., 2019) failed to calculate for the following reasons:   Cannot find a previous HDL lab   Cannot find a previous total cholesterol lab    Assessment & Plan:   Problem List Items Addressed This Visit     Elevated blood pressure reading in office with diagnosis of hypertension - Primary   Prescribed amlodipine  10 mg and hydrochlorothiazide  25 mg daily. Endorses taking only half of amlodipine  to equal 5 mg daily. Reports this helps the ankle swelling. Denies chest pain, shortness of breath, lower extremity edema, vision changes, headaches.  Blood pressure readings from home are well controlled. Not well controlled in the office after multiple checks. She will continue to monitor at home and if BP does not return to normal range, she will let provider know. DASH diet and moderate exercise daily. BMP today. Refill sent.  Follow-up in 3 months, sooner if needed.       Relevant Medications   amLODipine  (NORVASC ) 5 MG tablet   Other Relevant Orders   Basic metabolic panel with GFR  Agrees with plan of care discussed.  Questions answered.   Return in about 3 months (around 11/19/2023) for HTN.    Darice JONELLE Brownie, FNP

## 2023-08-19 NOTE — Assessment & Plan Note (Addendum)
 Prescribed amlodipine  10 mg and hydrochlorothiazide  25 mg daily. Endorses taking only half of amlodipine  to equal 5 mg daily. Reports this helps the ankle swelling. Denies chest pain, shortness of breath, lower extremity edema, vision changes, headaches.  Blood pressure readings from home are well controlled. Not well controlled in the office after multiple checks. She will continue to monitor at home and if BP does not return to normal range, she will let provider know. DASH diet and moderate exercise daily. BMP today. Refill sent.  Follow-up in 3 months, sooner if needed.

## 2023-08-20 ENCOUNTER — Ambulatory Visit: Payer: Self-pay | Admitting: Family Medicine

## 2023-08-20 LAB — BASIC METABOLIC PANEL WITH GFR
BUN/Creatinine Ratio: 14 (ref 12–28)
BUN: 13 mg/dL (ref 8–27)
CO2: 27 mmol/L (ref 20–29)
Calcium: 9.9 mg/dL (ref 8.7–10.3)
Chloride: 95 mmol/L — ABNORMAL LOW (ref 96–106)
Creatinine, Ser: 0.95 mg/dL (ref 0.57–1.00)
Glucose: 89 mg/dL (ref 70–99)
Potassium: 3.6 mmol/L (ref 3.5–5.2)
Sodium: 139 mmol/L (ref 134–144)
eGFR: 64 mL/min/1.73 (ref >59)

## 2023-11-14 ENCOUNTER — Other Ambulatory Visit: Payer: Self-pay | Admitting: Family Medicine

## 2023-11-14 DIAGNOSIS — I10 Essential (primary) hypertension: Secondary | ICD-10-CM

## 2023-11-20 ENCOUNTER — Other Ambulatory Visit: Payer: Self-pay | Admitting: Family Medicine

## 2023-11-20 DIAGNOSIS — I10 Essential (primary) hypertension: Secondary | ICD-10-CM

## 2023-11-20 NOTE — Progress Notes (Signed)
 CRISTI GWYNN                                          MRN: [id number]   11/20/2023   The [facility] Quality Team Specialist reviewed this patient medical record for the purposes of chart review for care gap closure. The following were reviewed: chart review for care gap closure-controlling blood pressure.    [facility] Quality Team

## 2023-11-24 ENCOUNTER — Ambulatory Visit (INDEPENDENT_AMBULATORY_CARE_PROVIDER_SITE_OTHER): Admitting: Family Medicine

## 2023-11-24 ENCOUNTER — Encounter: Payer: Self-pay | Admitting: Family Medicine

## 2023-11-24 VITALS — BP 132/80 | HR 55 | Ht 65.0 in | Wt 165.0 lb

## 2023-11-24 DIAGNOSIS — I10 Essential (primary) hypertension: Secondary | ICD-10-CM

## 2023-11-24 MED ORDER — AMLODIPINE BESYLATE 5 MG PO TABS
5.0000 mg | ORAL_TABLET | Freq: Every day | ORAL | 0 refills | Status: AC
Start: 2023-11-24 — End: ?

## 2023-11-24 MED ORDER — HYDROCHLOROTHIAZIDE 25 MG PO TABS
25.0000 mg | ORAL_TABLET | Freq: Every day | ORAL | 0 refills | Status: AC
Start: 2023-11-24 — End: ?

## 2023-11-24 NOTE — Progress Notes (Signed)
   Established Patient Office Visit  Subjective   Patient ID: Gina Dennis, female    DOB: 27-Jun-1950  Age: 73 y.o. MRN: [id number]  Chief Complaint  Patient presents with   Medical Management of Chronic Issues    HTN - Brought readings with her    HPI  Hypertension Medication compliance: Taking amlodipine  5 mg and hydrochlorothiazide  25 mg daily. Denies chest pain, shortness of breath, lower extremity edema, vision changes, headaches.  Pertinent lab work: BMP today  Monitoring at home: 125-144/70-80's Tolerating medication well: no side effects reported.  Continue current medication regimen: no change, well controlled in office and at home.  Follow-up: 3 months  Continue with diet and exercise as reported.    ROS    Objective:     BP 132/80 (Patient Position: Sitting, Cuff Size: Normal)   Pulse (!) 55   Ht 5' 5 (1.651 m)   Wt 165 lb (74.8 kg)   SpO2 100%   BMI 27.46 kg/m    Physical Exam Vitals and nursing note reviewed.  Constitutional:      General: She is not in acute distress.    Appearance: Normal appearance.  Cardiovascular:     Rate and Rhythm: Normal rate and regular rhythm.  Pulmonary:     Effort: Pulmonary effort is normal.     Breath sounds: Normal breath sounds.  Skin:    General: Skin is warm and dry.  Neurological:     General: No focal deficit present.     Mental Status: She is alert. Mental status is at baseline.  Psychiatric:        Mood and Affect: Mood normal.        Thought Content: Thought content normal.        Judgment: Judgment normal.      No results found for any visits on 11/24/23.    The ASCVD Risk score (Arnett DK, et al., 2019) failed to calculate for the following reasons:   Cannot find a previous HDL lab   Cannot find a previous total cholesterol lab    Assessment & Plan:   Problem List Items Addressed This Visit     Essential hypertension - Primary   Taking amlodipine  5 mg and hydrochlorothiazide  25 mg  daily. Denies chest pain, shortness of breath, lower extremity edema, vision changes, headaches.  Pertinent lab work: BMP due  Monitoring at home: 125-144/70-80's Well controlled in office upon recheck. Refills sent. BMP Follow-up in 3 months. Continue to monitor BP at home.       Relevant Medications   amLODipine  (NORVASC ) 5 MG tablet   hydrochlorothiazide  (HYDRODIURIL ) 25 MG tablet   Other Relevant Orders   Basic metabolic panel with GFR   Elevated blood pressure reading in office with diagnosis of hypertension   Relevant Medications   amLODipine  (NORVASC ) 5 MG tablet   hydrochlorothiazide  (HYDRODIURIL ) 25 MG tablet  Agrees with plan of care discussed.  Questions answered.   Return in about 3 months (around 02/24/2024) for HTN.    Darice JONELLE Brownie, FNP

## 2023-11-24 NOTE — Assessment & Plan Note (Signed)
 Taking amlodipine  5 mg and hydrochlorothiazide  25 mg daily. Denies chest pain, shortness of breath, lower extremity edema, vision changes, headaches.  Pertinent lab work: BMP due  Monitoring at home: 125-144/70-80's Well controlled in office upon recheck. Refills sent. BMP Follow-up in 3 months. Continue to monitor BP at home.

## 2023-11-25 ENCOUNTER — Ambulatory Visit: Payer: Self-pay | Admitting: Family Medicine

## 2023-11-25 LAB — BASIC METABOLIC PANEL WITH GFR
BUN/Creatinine Ratio: 13 (ref 12–28)
BUN: 14 mg/dL (ref 8–27)
CO2: 30 mmol/L — ABNORMAL HIGH (ref 20–29)
Calcium: 9.8 mg/dL (ref 8.7–10.3)
Chloride: 97 mmol/L (ref 96–106)
Creatinine, Ser: 1.05 mg/dL — ABNORMAL HIGH (ref 0.57–1.00)
Glucose: 117 mg/dL — ABNORMAL HIGH (ref 70–99)
Potassium: 4 mmol/L (ref 3.5–5.2)
Sodium: 142 mmol/L (ref 134–144)
eGFR: 57 mL/min/1.73 — ABNORMAL LOW (ref >59)

## 2023-12-07 NOTE — Telephone Encounter (Signed)
 Copied from CRM #[id number]. Topic: General - Other >> Dec 05, 2023  3:18 PM Rosaria E wrote: Reason for CRM: Pt's husband called requesting for assistance with charting her blood pressure readings. Has questions   Best contact: [phone]

## 2024-02-24 ENCOUNTER — Ambulatory Visit: Admitting: Family Medicine
